# Patient Record
Sex: Male | Born: 1955 | Race: Black or African American | Hispanic: No | Marital: Single | Smoking: Current every day smoker
Health system: Southern US, Community
[De-identification: ages and names within clinical notes are randomized; demographics above are authoritative.]

## PROBLEM LIST (undated history)

## (undated) DIAGNOSIS — M5126 Other intervertebral disc displacement, lumbar region: Secondary | ICD-10-CM

---

## 2017-05-05 ENCOUNTER — Emergency Department (HOSPITAL_COMMUNITY): Payer: Self-pay

## 2017-05-05 ENCOUNTER — Emergency Department (HOSPITAL_COMMUNITY)
Admission: EM | Admit: 2017-05-05 | Discharge: 2017-05-05 | Disposition: A | Discharge status: Home / Self Care | Payer: Self-pay | Attending: Emergency Medicine | Admitting: Emergency Medicine

## 2017-05-05 ENCOUNTER — Encounter (HOSPITAL_COMMUNITY): Payer: Self-pay | Admitting: Emergency Medicine

## 2017-05-05 DIAGNOSIS — M5441 Lumbago with sciatica, right side: Secondary | ICD-10-CM | POA: Insufficient documentation

## 2017-05-05 DIAGNOSIS — M5431 Sciatica, right side: Secondary | ICD-10-CM

## 2017-05-05 HISTORY — DX: Other intervertebral disc displacement, lumbar region: M51.26

## 2017-05-05 MED ORDER — CYCLOBENZAPRINE HCL 10 MG PO TABS: 10.0000 mg | ORAL_TABLET | Freq: Every day | ORAL | 0 refills | Status: DC

## 2017-05-05 MED ORDER — HYDROCODONE-ACETAMINOPHEN 5-325 MG PO TABS: 1.0000 | ORAL_TABLET | Freq: Four times a day (QID) | ORAL | 0 refills | Status: DC | PRN

## 2017-05-05 MED ORDER — PREDNISONE 50 MG PO TABS: 50.0000 mg | ORAL_TABLET | Freq: Every day | ORAL | 0 refills | Status: DC

## 2017-05-05 MED ORDER — TRAMADOL HCL 50 MG PO TABS
50.0000 mg | ORAL_TABLET | Freq: Once | ORAL | Status: AC
  Administered 2017-05-05: 50 mg via ORAL
  Filled 2017-05-05: qty 1

## 2017-05-05 NOTE — ED Notes (Signed)
Patient transported to X-ray 

## 2017-05-05 NOTE — Discharge Instructions (Signed)
Return here as needed.  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  They can contribute to these symptoms to follow-up with the neurosurgeon provided
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
## 2017-05-05 NOTE — ED Triage Notes (Signed)
Pt is c/o right hip pain and numbness in his right thigh  Pt states he has a herniated disc   Pt states the numbness started about 2 weeks ago

## 2017-12-13 ENCOUNTER — Encounter (HOSPITAL_COMMUNITY): Payer: Self-pay

## 2017-12-13 ENCOUNTER — Other Ambulatory Visit: Payer: Self-pay

## 2017-12-13 ENCOUNTER — Emergency Department (HOSPITAL_COMMUNITY)
Admission: EM | Admit: 2017-12-13 | Discharge: 2017-12-13 | Disposition: A | Discharge status: Home / Self Care | Payer: Self-pay | Attending: Emergency Medicine | Admitting: Emergency Medicine

## 2017-12-13 ENCOUNTER — Emergency Department (HOSPITAL_COMMUNITY): Payer: Self-pay

## 2017-12-13 DIAGNOSIS — Y929 Unspecified place or not applicable: Secondary | ICD-10-CM | POA: Insufficient documentation

## 2017-12-13 DIAGNOSIS — S161XXA Strain of muscle, fascia and tendon at neck level, initial encounter: Secondary | ICD-10-CM | POA: Insufficient documentation

## 2017-12-13 DIAGNOSIS — Y999 Unspecified external cause status: Secondary | ICD-10-CM | POA: Insufficient documentation

## 2017-12-13 DIAGNOSIS — Y939 Activity, unspecified: Secondary | ICD-10-CM | POA: Insufficient documentation

## 2017-12-13 DIAGNOSIS — M542 Cervicalgia: Secondary | ICD-10-CM

## 2017-12-13 DIAGNOSIS — T148XXA Other injury of unspecified body region, initial encounter: Secondary | ICD-10-CM

## 2017-12-13 MED ORDER — METHOCARBAMOL 500 MG PO TABS: 500.0000 mg | ORAL_TABLET | Freq: Two times a day (BID) | ORAL | 0 refills | Status: DC

## 2017-12-13 MED ORDER — KETOROLAC TROMETHAMINE 15 MG/ML IJ SOLN
15.0000 mg | Freq: Once | INTRAMUSCULAR | Status: AC
  Administered 2017-12-13: 15 mg via INTRAMUSCULAR
  Filled 2017-12-13: qty 1
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
## 2017-12-13 NOTE — Discharge Instructions (Signed)
Your imaging is reassuring, neck pain is likely due to muscle strain and injury, this pain should improve over time with pain medication, muscle relaxers heat and light stretching.  If your symptoms are not improving after a few days of this treatment is follow-up with your primary doctor.  If you develop worsening neck pain, weakness, numbness or tingling in your arms or hands please return to the ED for sooner evaluation
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
## 2018-02-13 ENCOUNTER — Encounter (HOSPITAL_COMMUNITY): Payer: Self-pay | Admitting: Emergency Medicine

## 2018-02-13 ENCOUNTER — Emergency Department (HOSPITAL_COMMUNITY)
Admission: EM | Admit: 2018-02-13 | Discharge: 2018-02-13 | Disposition: A | Discharge status: Home / Self Care | Payer: No Typology Code available for payment source | Attending: Emergency Medicine | Admitting: Emergency Medicine

## 2018-02-13 ENCOUNTER — Other Ambulatory Visit: Payer: Self-pay

## 2018-02-13 DIAGNOSIS — Y9389 Activity, other specified: Secondary | ICD-10-CM | POA: Insufficient documentation

## 2018-02-13 DIAGNOSIS — Y92410 Unspecified street and highway as the place of occurrence of the external cause: Secondary | ICD-10-CM | POA: Diagnosis not present

## 2018-02-13 DIAGNOSIS — Z79899 Other long term (current) drug therapy: Secondary | ICD-10-CM | POA: Insufficient documentation

## 2018-02-13 DIAGNOSIS — F17210 Nicotine dependence, cigarettes, uncomplicated: Secondary | ICD-10-CM | POA: Insufficient documentation

## 2018-02-13 DIAGNOSIS — R42 Dizziness and giddiness: Secondary | ICD-10-CM | POA: Diagnosis not present

## 2018-02-13 DIAGNOSIS — Y999 Unspecified external cause status: Secondary | ICD-10-CM | POA: Insufficient documentation

## 2018-02-13 DIAGNOSIS — M62830 Muscle spasm of back: Secondary | ICD-10-CM | POA: Diagnosis not present

## 2018-02-13 DIAGNOSIS — M62838 Other muscle spasm: Secondary | ICD-10-CM

## 2018-02-13 DIAGNOSIS — M546 Pain in thoracic spine: Secondary | ICD-10-CM | POA: Diagnosis present

## 2018-02-13 MED ORDER — CYCLOBENZAPRINE HCL 10 MG PO TABS: 10.0000 mg | ORAL_TABLET | Freq: Two times a day (BID) | ORAL | 0 refills | Status: DC | PRN
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
## 2018-02-13 NOTE — Discharge Instructions (Signed)
Please read instructions below. Apply ice to your neck/back for 20 minutes at a time. You can take advil/ibuprofen every 6 hours as needed for pain. You can take flexeril as needed for muscle spasm. Schedule an appointment with your primary care to follow-up if symptoms persist. Return to the ER for severe headache, vision changes, vomiting, or new or concerning symptoms.
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
## 2021-09-02 ENCOUNTER — Telehealth: Payer: Self-pay

## 2021-09-02 ENCOUNTER — Other Ambulatory Visit (HOSPITAL_COMMUNITY): Payer: Self-pay

## 2021-09-02 NOTE — Telephone Encounter (Signed)
[facility] Patient Advocate Encounter  Insurance verification completed.    The patient is insured through [id number].  Medication will need a PA.  We will continue to follow to see if copay assistance is needed.  Karlis Cregg, CPhT [facility] Phone: [phone] Fax:  [phone]  

## 2021-09-03 ENCOUNTER — Encounter: Payer: Self-pay | Admitting: Family

## 2021-11-05 ENCOUNTER — Encounter: Payer: Medicaid Other | Admitting: Family

## 2021-11-11 ENCOUNTER — Encounter: Payer: Medicaid Other | Admitting: Internal Medicine

## 2021-11-16 ENCOUNTER — Encounter: Payer: Medicaid Other | Admitting: Internal Medicine

## 2021-11-16 ENCOUNTER — Telehealth: Payer: Self-pay

## 2021-11-16 DIAGNOSIS — Z5900 Homelessness unspecified: Secondary | ICD-10-CM | POA: Insufficient documentation

## 2021-11-16 DIAGNOSIS — B182 Chronic viral hepatitis C: Secondary | ICD-10-CM | POA: Insufficient documentation

## 2021-11-16 DIAGNOSIS — R030 Elevated blood-pressure reading, without diagnosis of hypertension: Secondary | ICD-10-CM | POA: Insufficient documentation

## 2021-11-16 NOTE — Telephone Encounter (Signed)
Patient No Showed on 11/16/21 with Dr Earlene Plater. I spoke to him and discussed no show policy. Patient has been rescheduled with Dr Renold Don for 11/24/2021 930 AM and agrees he will call 24-48 hours prior if he can not keep this appt

## 2021-11-16 NOTE — Progress Notes (Deleted)
[facility]  Reason for Consult: Chronic hepatitis C  Referring Provider: [facility]   HPI:    Ricky Reeves is a 65 y.o. male who presents for initial evaluation and management of chronic hepatitis C.   Past medical history as below.  Patient has a history of being diagnosed with hepatitis C approximately 3 years ago.  He has never been evaluated for further follow up or treatment.  He has a history of injection drug use and housing instability.  He has no recent lab work available for review.  He has no complaints today such as fevers, chills, nausea, vomiting, abdominal pain, jaundice.  He is interested in treatment for his chronic infection.  Patient's Medications  New Prescriptions   No medications on file  Previous Medications   ASPIRIN-CAFFEINE (BAYER BACK & BODY PO)    Take 2 tablets by mouth as needed (pain).   CYCLOBENZAPRINE (FLEXERIL) 10 MG TABLET    Take 1 tablet (10 mg total) by mouth 2 (two) times daily as needed for muscle spasms.   HYDROCODONE-ACETAMINOPHEN (NORCO/VICODIN) 5-325 MG TABLET    Take 1 tablet by mouth every 6 (six) hours as needed for moderate pain.   METHOCARBAMOL (ROBAXIN) 500 MG TABLET    Take 1 tablet (500 mg total) by mouth 2 (two) times daily.   PREDNISONE (DELTASONE) 50 MG TABLET    Take 1 tablet (50 mg total) by mouth daily.  Modified Medications   No medications on file  Discontinued Medications   No medications on file      Past Medical History:  Diagnosis Date   Lumbar herniated disc     Social History   Tobacco Use   Smoking status: Every Day    Packs/day: 0.50    Types: Cigarettes   Smokeless tobacco: Never  Vaping Use   Vaping Use: Never used  Substance Use Topics   Alcohol use: Yes    Comment: beer 2-3 times a week   Drug use: No    Family History  Problem Relation Age of Onset   Cancer Mother    Diabetes Brother     No Known  Allergies  ROS    OBJECTIVE:    There were no vitals filed for this visit.   There is no height or weight on file to calculate BMI.  Physical Exam    ASSESSMENT & PLAN:    No problem-specific Assessment & Plan notes found for this encounter.   No orders of the defined types were placed in this encounter.    There are no diagnoses linked to this encounter.   New Patient with Chronic Hepatitis C genotype unknown, untreated.   I discussed with the patient the lab findings that confirm chronic hepatitis C.  I discussed the pathogenesis, transmission, prevention, risks of left untreated, and treatment options for hepatitis C. I  discussed the importance and benefits of treatment.  PLAN: -- Patient counseled on limiting acetaminophen, avoidance of alcohol. -- Transmission discussed with patient including sexual transmission, injection drug use, sharing razors and toothbrush.   -- Will need referral to gastroenterology if concern for cirrhosis -- Will prescribe appropriate medication based on genotype and coverage  -- Hepatitis A and B titers with vaccination as needed -- Pneumococcal vaccine if not previously given and evidence of cirrhosis -- Further work up to include liver staging with Fibrosis scoring and calculated FIB-4 score.  Elastography if concern for  advanced fibrosis and/or cirrhosis -- Will follow up after starting medication -- Labs needed within the last 6 months:   CBC CMP PT/INR Hep A and B serologies  HIV HCV genotype HCV RNA Measure of fibrosis NS5A resistance testing in select scenarios   Vedia Coffer for Infectious Disease [facility] 11/16/2021, 7:52 AM

## 2021-11-24 ENCOUNTER — Ambulatory Visit: Payer: Medicare Other | Admitting: Internal Medicine

## 2021-12-02 ENCOUNTER — Ambulatory Visit: Payer: Medicare Other | Admitting: Internal Medicine

## 2021-12-03 ENCOUNTER — Ambulatory Visit: Payer: Medicare Other | Admitting: Internal Medicine

## 2021-12-25 ENCOUNTER — Other Ambulatory Visit: Payer: Self-pay | Admitting: Nurse Practitioner

## 2021-12-25 ENCOUNTER — Other Ambulatory Visit (HOSPITAL_COMMUNITY): Payer: Self-pay | Admitting: Nurse Practitioner

## 2021-12-25 DIAGNOSIS — F17210 Nicotine dependence, cigarettes, uncomplicated: Secondary | ICD-10-CM

## 2022-01-05 ENCOUNTER — Ambulatory Visit (INDEPENDENT_AMBULATORY_CARE_PROVIDER_SITE_OTHER): Payer: Medicare Other | Admitting: Internal Medicine

## 2022-01-05 ENCOUNTER — Other Ambulatory Visit (HOSPITAL_COMMUNITY)
Admission: RE | Admit: 2022-01-05 | Discharge: 2022-01-05 | Disposition: A | Discharge status: Home / Self Care | Payer: Medicare Other | Source: Ambulatory Visit | Attending: Internal Medicine | Admitting: Internal Medicine

## 2022-01-05 ENCOUNTER — Other Ambulatory Visit: Payer: Self-pay

## 2022-01-05 ENCOUNTER — Encounter: Payer: Self-pay | Admitting: Internal Medicine

## 2022-01-05 VITALS — BP 169/76 | HR 90 | Temp 98.2°F | Wt 185.0 lb

## 2022-01-05 DIAGNOSIS — B182 Chronic viral hepatitis C: Secondary | ICD-10-CM

## 2022-01-05 DIAGNOSIS — Z113 Encounter for screening for infections with a predominantly sexual mode of transmission: Secondary | ICD-10-CM

## 2022-01-05 NOTE — Progress Notes (Signed)
[facility]  Reason for Consult:new hep c patient evaluation Referring Provider: [facility] -- Verdene Lennert    Patient Active Problem List   Diagnosis Date Noted   Chronic viral hepatitis C (HCC) 11/16/2021   Homelessness 11/16/2021   Elevated blood pressure reading 11/16/2021      HPI: Ricky Reeves is a 66 y.o. male homeless, referred to [facility] for hep c evaluation  He is new to [place]. Reviewed outside medical record Dx'ed with hep c 2020 -- no labs record to review No prior treatment   Other hx: Prior syphilis -- last treated about half a year prior to this 12/2021 visit. Treated once only.   Timing of diagnosis: 3 years prior to 12/2021 visit  Risk: hx ivdu more than 10 years prior to this initial visit, hx of indu (cocaine) last use 3 years prior to this visit; no tatoos or blood transfusion; hx unprotected sex but unclear exposure  Cirrhosis finding: no hx n/v/hematemesis, bloody/black stool, abd distension, fluid withdrawal from abd/lungs, hx confusion, fatigue, edema, weight loss, poor apetite  Extra gi manifestation: No abnormal skin rash, fatigue, diffuse joint pain/swelling, hx kidney disease  Social situation: Homeless; no shelter; no stable address Phone [phone] No current iv/indu Smokes Etoh -- at least two forty ounce bottles a day. Gets drunk and shakes but no seizure prior  We reviewed: Natural history of hep c and its complications available treatment options for hepatitis C Other factors potentially worsening liver disease, including alcohol use; obesity; diabetes mellitus, and viral coinfection We discussed potential medications that can contribute to liver inflammation like acetaminophen, and to avoiding excessive amount (more than 3 gram daily use) acetaminophen    Review of Systems: ROS All other ros negative      Past Medical History:  Diagnosis Date   Lumbar herniated disc      Social History   Tobacco Use   Smoking status: Every Day    Packs/day: 0.50    Types: Cigarettes   Smokeless tobacco: Never  Vaping Use   Vaping Use: Never used  Substance Use Topics   Alcohol use: Yes    Comment: beer 2-3 times a week   Drug use: No    Family History  Problem Relation Age of Onset   Cancer Mother    Diabetes Brother     No Known Allergies  OBJECTIVE: There were no vitals filed for this visit. There is no height or weight on file to calculate BMI.   Physical Exam General/constitutional: no distress, pleasant HEENT: Normocephalic, PER, Conj Clear, EOMI, Oropharynx clear Neck supple CV: rrr no mrg Lungs: clear to auscultation, normal respiratory effort Abd: Soft, Nontender Ext: no edema Skin: No Rash Neuro: nonfocal MSK: no peripheral joint swelling/tenderness/warmth; back spines nontender   Lab:  Microbiology:  Serology:  Imaging:   Assessment/plan: Problem List Items Addressed This Visit       Digestive   Chronic viral hepatitis C (HCC) - Primary   Relevant Orders   CBC   Hepatitis C RNA quantitative   Protime-INR   Hepatitis C genotype   Hepatitis C antibody   COMPLETE METABOLIC PANEL WITH GFR   Hepatitis B Core Antibody, total   Hepatitis B surface antibody,quantitative   Hepatitis B Surface AntiGEN   Hepatitis A Ab, Total   Other Visit Diagnoses     Screening for STDs (sexually transmitted diseases)       Relevant Orders  HIV antibody (with reflex)   RPR   Fluorescent treponemal ab(fta)-IgG-bld       #chronic hep c  Prior treatment: no GT: unknown Evidence of cirrhosis: not physically or by history -- will get labs and elastography Interested in treatment Potential DDI: none (no other ppi/statin or meds)   -Labs: today -Imaging: planning elastography -follow up: 4 weeks -Meds planned: unclear at this time, suspect mavyret  -discussed natural progression of hep c, transmission (avoid sharing  personal hygiene equipment) -discussed avoid toxin like etoh and excessive acetamaminphen (no more than 2 gram a day) -discussed healthy life style and good glucose control -discussed avoiding eating raw sea food -discussed we can treat hep c but can be reinfected -discussed hepatitis coinfection and vaccination   #std screening Repeat rpr testing. 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  I have spent a total of 65 minutes of face-to-face and non-face-to-face time, excluding clinical staff time, preparing to see patient, ordering tests and/or medications, and provide counseling the patient    Raymondo Band, MD [facility] [phone] pager   [phone] cell 01/05/2022, 9:23 AM

## 2022-01-05 NOTE — Patient Instructions (Signed)
Blood test, urine test today  Liver imaging to be scheduled   We need all these information to decide treatment and see if other procedures are needed to be done   Make follow up appointment with me in 4 weeks

## 2022-01-06 LAB — URINE CYTOLOGY ANCILLARY ONLY
Chlamydia: NEGATIVE
Comment: NEGATIVE
Comment: NEGATIVE
Comment: NORMAL
Neisseria Gonorrhea: NEGATIVE
Trichomonas: NEGATIVE

## 2022-01-10 LAB — COMPLETE METABOLIC PANEL WITH GFR
AG Ratio: 0.8 (calc) — ABNORMAL LOW (ref 1.0–2.5)
ALT: 198 U/L — ABNORMAL HIGH (ref 9–46)
AST: 234 U/L — ABNORMAL HIGH (ref 10–35)
Albumin: 3.7 g/dL (ref 3.6–5.1)
Alkaline phosphatase (APISO): 61 U/L (ref 35–144)
BUN: 14 mg/dL (ref 7–25)
CO2: 29 mmol/L (ref 20–32)
Calcium: 9.1 mg/dL (ref 8.6–10.3)
Chloride: 102 mmol/L (ref 98–110)
Creat: 0.74 mg/dL (ref 0.70–1.35)
Globulin: 4.9 g/dL (calc) — ABNORMAL HIGH (ref 1.9–3.7)
Glucose, Bld: 96 mg/dL (ref 65–99)
Potassium: 4.3 mmol/L (ref 3.5–5.3)
Sodium: 136 mmol/L (ref 135–146)
Total Bilirubin: 1.3 mg/dL — ABNORMAL HIGH (ref 0.2–1.2)
Total Protein: 8.6 g/dL — ABNORMAL HIGH (ref 6.1–8.1)
eGFR: 101 mL/min/{1.73_m2} (ref >=60)

## 2022-01-10 LAB — CBC
HCT: 41 % (ref 38.5–50.0)
Hemoglobin: 14.3 g/dL (ref 13.2–17.1)
MCH: 34.8 pg — ABNORMAL HIGH (ref 27.0–33.0)
MCHC: 34.9 g/dL (ref 32.0–36.0)
MCV: 99.8 fL (ref 80.0–100.0)
MPV: 12.1 fL (ref 7.5–12.5)
Platelets: 137 10*3/uL — ABNORMAL LOW (ref 140–400)
RBC: 4.11 10*6/uL — ABNORMAL LOW (ref 4.20–5.80)
RDW: 11 % (ref 11.0–15.0)
WBC: 3.8 10*3/uL (ref 3.8–10.8)

## 2022-01-10 LAB — RPR TITER: RPR Titer: 1:4 {titer} — ABNORMAL HIGH

## 2022-01-10 LAB — HEPATITIS B SURFACE ANTIBODY, QUANTITATIVE: Hepatitis B-Post: 11 m[IU]/mL (ref >=10)

## 2022-01-10 LAB — HIV ANTIBODY (ROUTINE TESTING W REFLEX): HIV 1&2 Ab, 4th Generation: NONREACTIVE

## 2022-01-10 LAB — HEPATITIS C RNA QUANTITATIVE
HCV Quantitative Log: 6.35 log IU/mL — ABNORMAL HIGH
HCV RNA, PCR, QN: 2230000 IU/mL — ABNORMAL HIGH

## 2022-01-10 LAB — PROTIME-INR
INR: 1.1
Prothrombin Time: 10.9 s (ref 9.0–11.5)

## 2022-01-10 LAB — FLUORESCENT TREPONEMAL AB(FTA)-IGG-BLD: Fluorescent Treponemal ABS: REACTIVE — AB

## 2022-01-10 LAB — HEPATITIS B CORE ANTIBODY, TOTAL: Hep B Core Total Ab: REACTIVE — AB

## 2022-01-10 LAB — HEPATITIS B SURFACE ANTIGEN: Hepatitis B Surface Ag: NONREACTIVE

## 2022-01-10 LAB — HEPATITIS C ANTIBODY
Hepatitis C Ab: REACTIVE — AB
SIGNAL TO CUT-OFF: 10.61 — ABNORMAL HIGH (ref <1.00)

## 2022-01-10 LAB — HEPATITIS A ANTIBODY, TOTAL: Hepatitis A AB,Total: NONREACTIVE

## 2022-01-10 LAB — HEPATITIS C GENOTYPE

## 2022-01-10 LAB — RPR: RPR Ser Ql: REACTIVE — AB

## 2022-01-11 ENCOUNTER — Telehealth: Payer: Self-pay

## 2022-01-11 NOTE — Telephone Encounter (Signed)
Attempted to call Gwenlyn Perking with [facility] to see if patient had a prior RPR and tx - no voicemail box set up to leave a message.    [place], CMA

## 2022-01-11 NOTE — Telephone Encounter (Signed)
-----   Message from Jabier Mutton, MD sent at 01/11/2022  8:38 AM EST ----- Hi team. Could you help me get his prior rpr and tx info from [place].  Thanks

## 2022-01-13 ENCOUNTER — Ambulatory Visit (HOSPITAL_COMMUNITY): Payer: Medicare Other

## 2022-01-13 ENCOUNTER — Ambulatory Visit (HOSPITAL_COMMUNITY): Admission: RE | Admit: 2022-01-13 | Payer: Medicare Other | Source: Ambulatory Visit

## 2022-01-13 ENCOUNTER — Encounter (HOSPITAL_COMMUNITY): Payer: Self-pay

## 2022-01-13 NOTE — Telephone Encounter (Signed)
Spoke to [facility] - Most recent RPR 1.512 Treated on 01/26/2021 with bicillin 2.4 x 1

## 2022-01-20 ENCOUNTER — Other Ambulatory Visit (HOSPITAL_COMMUNITY): Payer: Medicare Other

## 2022-01-25 ENCOUNTER — Ambulatory Visit (HOSPITAL_COMMUNITY): Payer: Medicare Other

## 2022-02-04 ENCOUNTER — Encounter (HOSPITAL_COMMUNITY): Payer: Self-pay

## 2022-02-04 ENCOUNTER — Ambulatory Visit (HOSPITAL_COMMUNITY): Admission: RE | Admit: 2022-02-04 | Payer: Medicare Other | Source: Ambulatory Visit

## 2022-02-04 ENCOUNTER — Ambulatory Visit (HOSPITAL_COMMUNITY): Payer: Medicare Other

## 2022-02-16 ENCOUNTER — Ambulatory Visit (HOSPITAL_COMMUNITY)
Admission: RE | Admit: 2022-02-16 | Discharge: 2022-02-16 | Disposition: A | Discharge status: Home / Self Care | Payer: Medicare Other | Source: Ambulatory Visit | Attending: Internal Medicine | Admitting: Internal Medicine

## 2022-02-16 ENCOUNTER — Other Ambulatory Visit: Payer: Self-pay

## 2022-02-16 ENCOUNTER — Ambulatory Visit (HOSPITAL_COMMUNITY)
Admission: RE | Admit: 2022-02-16 | Discharge: 2022-02-16 | Disposition: A | Discharge status: Home / Self Care | Payer: Medicare Other | Source: Ambulatory Visit | Attending: Nurse Practitioner | Admitting: Nurse Practitioner

## 2022-02-16 DIAGNOSIS — F17210 Nicotine dependence, cigarettes, uncomplicated: Secondary | ICD-10-CM | POA: Insufficient documentation

## 2022-02-16 DIAGNOSIS — B182 Chronic viral hepatitis C: Secondary | ICD-10-CM | POA: Diagnosis present

## 2022-03-25 ENCOUNTER — Telehealth: Payer: Self-pay | Admitting: Internal Medicine

## 2022-03-25 NOTE — Telephone Encounter (Signed)
Called pt back to schedule a f/u appt with Dr. Gale Journey. Pt did not answer, and no VM available.  ?

## 2022-04-01 ENCOUNTER — Ambulatory Visit: Payer: Medicare Other | Admitting: Internal Medicine

## 2022-04-21 ENCOUNTER — Ambulatory Visit: Payer: Medicare Other | Admitting: Internal Medicine

## 2022-04-22 ENCOUNTER — Other Ambulatory Visit: Payer: Self-pay

## 2022-04-22 ENCOUNTER — Ambulatory Visit (INDEPENDENT_AMBULATORY_CARE_PROVIDER_SITE_OTHER): Payer: Medicare Other | Admitting: Internal Medicine

## 2022-04-22 ENCOUNTER — Telehealth: Payer: Self-pay

## 2022-04-22 ENCOUNTER — Encounter: Payer: Self-pay | Admitting: Internal Medicine

## 2022-04-22 ENCOUNTER — Other Ambulatory Visit (HOSPITAL_COMMUNITY): Payer: Self-pay

## 2022-04-22 VITALS — BP 145/88 | HR 86 | Temp 97.2°F | Ht 71.0 in | Wt 175.0 lb

## 2022-04-22 DIAGNOSIS — B182 Chronic viral hepatitis C: Secondary | ICD-10-CM | POA: Diagnosis not present

## 2022-04-22 DIAGNOSIS — Z8619 Personal history of other infectious and parasitic diseases: Secondary | ICD-10-CM

## 2022-04-22 NOTE — Progress Notes (Addendum)
?  ? ? ? ? ?[facility] ? ?Reason for Consult:new hep c patient evaluation ?Referring Provider: [facility] -- Lily Peer ? ? ? ?Patient Active Problem List  ? Diagnosis Date Noted  ? Chronic viral hepatitis C ([place]) 11/16/2021  ? Homelessness 11/16/2021  ? Elevated blood pressure reading 11/16/2021  ? ? ? ? ?HPI: Ricky Reeves is a 66 y.o. male homeless, referred to [facility] for hep c evaluation ? 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  ?Hepatitis screening showed prior hep b infection and immune/cleared ?Hiv screen negative ? ?Hep c GT1 ? ? ?I first saw him 01/05/2022 for initial visit: ?-------------- ?He is new to [place]. ?Reviewed outside medical record ?Dx'ed with hep c 2020 -- no labs record to review ?No prior treatment  ? ?Other hx: ?Prior syphilis -- last treated about half a year prior to this 12/2021 visit. Treated once only.  ? ?Timing of diagnosis: ?3 years prior to 12/2021 visit ? ?Risk: hx ivdu more than 10 years prior to this initial visit, hx of indu (cocaine) last use 3 years prior to this visit; no tatoos or blood transfusion; hx unprotected sex but unclear exposure ? ?Cirrhosis finding: no hx n/v/hematemesis, bloody/black stool, abd distension, fluid withdrawal from abd/lungs, hx confusion, fatigue, edema, weight loss, poor apetite ? ?Extra gi manifestation: No abnormal skin rash, fatigue, diffuse joint pain/swelling, hx kidney disease ? ?Social situation: ?Homeless; no shelter; no stable address ?Phone [phone] ?No current iv/indu ?Smokes ?Etoh -- at least two forty ounce bottles a day. Gets drunk and shakes but no seizure prior ? ?We reviewed: ?Natural history of hep c and its complications ?available treatment options for hepatitis C ?Other factors potentially  worsening liver disease, including alcohol use; obesity; diabetes mellitus, and viral coinfection ?We discussed potential medications that can contribute to liver inflammation like acetaminophen, and to avoiding excessive amount (more than 3 gram daily use) acetaminophen ? ? ? ?Review of Systems: ?ROS ?All other ros negative ? ? ? ? ? ?Past Medical History:  ?Diagnosis Date  ? Lumbar herniated disc   ? ? ?Social History  ? ?Tobacco Use  ? Smoking status: Every Day  ?  Packs/day: 0.50  ?  Types: Cigarettes  ? Smokeless tobacco: Never  ?Vaping Use  ? Vaping Use: Never used  ?Substance Use Topics  ? Alcohol use: Yes  ?  Comment: 2 40 once beers a day  ? Drug use: Yes  ?  Types: Marijuana  ?  Comment: sometimes  ? ? ?Family History  ?Problem Relation Age of Onset  ? Cancer Mother   ? Diabetes Brother   ? ? ?No Known Allergies ? ?OBJECTIVE: ?Vitals:  ? 04/22/22 1004  ?BP: (!) 145/88  ?Pulse: 86  ?Temp: (!) 97.2 ?F (36.2 ?C)  ?TempSrc: Oral  ?SpO2: 98%  ?Weight: 175 lb (79.4 kg)  ?Height: $RemoveB'5\' 11"'Dkbgzqch$  (1.803 m)  ? ?Body mass index is 24.41 kg/m?. ? ? ?Physical Exam ?General/constitutional: no distress, pleasant ?HEENT: Normocephalic, PER, Conj Clear, EOMI, Oropharynx clear ?Neck supple ?CV: rrr no mrg ?Lungs: clear to auscultation, normal respiratory effort ?Abd: Soft, Nontender ?Ext: no edema ?Skin: No Rash ?Neuro: nonfocal ?MSK: no peripheral joint swelling/tenderness/warmth; back spines nontender ? ? ? ? ?Lab: ?Lab Results  ?Component Value Date  ? WBC 3.8 01/05/2022  ?  HGB 14.3 01/05/2022  ? HCT 41.0 01/05/2022  ? MCV 99.8 01/05/2022  ? PLT 137 (L) 01/05/2022  ? ?Last metabolic panel ?Lab Results  ?Component Value Date  ? GLUCOSE 96 01/05/2022  ? NA 136 01/05/2022  ? K 4.3 01/05/2022  ? CL 102 01/05/2022  ? CO2 29 01/05/2022  ? BUN 14 01/05/2022  ? CREATININE 0.74 01/05/2022  ? EGFR 101 01/05/2022  ? CALCIUM 9.1 01/05/2022  ? PROT 8.6 (H) 01/05/2022  ? BILITOT 1.3 (H) 01/05/2022  ? AST 234 (H) 01/05/2022  ? ALT 198 (H)  01/05/2022  ? ?Fib4 = 7.9 ? ? ?Microbiology: ? ?Serology: ?12/2021 hep c gt1a; vl 2.74mil ?12/2021 hep b sAb 11; cAb positive; sAg negative ?12/2021 rpr 1:4 ?12/2021 hiv negative ? ?Imaging: ? ? ?Assessment/plan: ?Problem List Items Addressed This Visit   ? ?  ? Digestive  ? Chronic viral hepatitis C ([place]) - Primary  ? ? ?#chronic hep c ? ?Prior treatment: no ?GT: 1a ?Evidence of cirrhosis: not physically or by history -- fib4 suggest advance fibrosis but elastography doesn't. I worry he actually might have advance fibrosis with chronic alcohol use. Talked about getting liver biopsy --> he'll want to think about it but agrees to schedule the biopsy several weeks from now ?Interested in treatment ?Potential DDI: none (no other ppi/statin or meds) ? ?Discussed with him that we need to be certain about cirrhosis status not just for treatment but for ongoing care as well. ? ?-Labs: no need today ?-Imaging: will refer for liver biopsy by IR ?-follow up: 2 months ?-Meds planned: unclear at this time, suspect epclusa given at least I suspect fibrosis with chronic alcohol use. Will see after liver biopsy ?-advise him he needs to get off alcohol -- he said he is thinking about the detox ? ? ? ?-discussed natural progression of hep c, transmission (avoid sharing personal hygiene equipment) ?-discussed avoid toxin like etoh and excessive acetamaminphen (no more than 2 gram a day) ?-discussed healthy life style and good glucose control ?-discussed avoiding eating raw sea food ?-discussed we can treat hep c but can be reinfected ?-discussed hepatitis coinfection and vaccination ? ? ?#std screening ?#hx syphilis ? ?He was treated within the past year 2022 by the [facility] ?He denies sexual exposure since then ?Hiv/gc/chlam negative ? ?-will check previous rpr titer record ?-will repeat rpr testing in the next 6-12 months ? ? ?#homelessness ?He's trying to get a place to stay. He is sleeping in his car now.  ? ? ? ? ?Follow-up: Return in  about 2 months (around 06/22/2022). ? ? ?-------- ?Addendum ?[facility] rpr labs: ?12/2020 1:512 ?08/2021 1:8 ?12/2021 1:4 ? ?He was treated sometimes in 2022 after the 512 titer as early latent.  ? ?Jabier Mutton, MD ?[facility] ?[place] ?[phone] pager   [phone] cell ?04/22/2022, 10:15 AM ? ?

## 2022-04-22 NOTE — Telephone Encounter (Signed)
Per Zella Ball at [facility], RPR history is as follows:  ? ?Jan 2022 RPR 1:512 ?Sept 2022 RPR 1:8 ?Jan 2023 RPR 1:4 ? ?Sandie Ano, RN ? ?

## 2022-04-22 NOTE — Patient Instructions (Signed)
Will need to get a liver biopsy to see how to care for you ? ? ?I will order for the radiologist to get a liver biopsy within the next 4-6 weeks ? ? ?See me 2 weeks after your liver biopsy to decide on treatment ? ? ?Will follow up with your dhs department about your syphilis status ?

## 2022-04-22 NOTE — Telephone Encounter (Signed)
[facility] Patient Advocate Encounter ?  ?Received notification from [employer] Medicare Part D that prior authorization for Raeanne Gathers is required. ?  ?PA submitted on 04/22/22 ?Key [id number] ?Status is pending ?   ?[facility] will continue to follow. ? ? ?Ileene Patrick, CPhT ?Specialty Pharmacy Patient Advocate ?[facility] ?Phone: [phone] ?Fax:  [phone]  ?

## 2022-04-23 ENCOUNTER — Other Ambulatory Visit (HOSPITAL_COMMUNITY): Payer: Self-pay

## 2022-04-23 ENCOUNTER — Other Ambulatory Visit: Payer: Self-pay

## 2022-04-23 ENCOUNTER — Emergency Department (HOSPITAL_COMMUNITY): Payer: Medicare Other

## 2022-04-23 ENCOUNTER — Telehealth: Payer: Self-pay

## 2022-04-23 ENCOUNTER — Inpatient Hospital Stay (HOSPITAL_COMMUNITY)
Admission: EM | Admit: 2022-04-23 | Discharge: 2022-04-26 | DRG: 200 | Disposition: A | Discharge status: Home / Self Care | Payer: Medicare Other | Attending: Surgery | Admitting: Surgery

## 2022-04-23 DIAGNOSIS — F17210 Nicotine dependence, cigarettes, uncomplicated: Secondary | ICD-10-CM | POA: Diagnosis present

## 2022-04-23 DIAGNOSIS — Z20822 Contact with and (suspected) exposure to covid-19: Secondary | ICD-10-CM | POA: Diagnosis present

## 2022-04-23 DIAGNOSIS — J939 Pneumothorax, unspecified: Principal | ICD-10-CM

## 2022-04-23 DIAGNOSIS — Z833 Family history of diabetes mellitus: Secondary | ICD-10-CM

## 2022-04-23 DIAGNOSIS — B182 Chronic viral hepatitis C: Secondary | ICD-10-CM | POA: Diagnosis present

## 2022-04-23 DIAGNOSIS — S270XXA Traumatic pneumothorax, initial encounter: Secondary | ICD-10-CM | POA: Diagnosis not present

## 2022-04-23 DIAGNOSIS — S060XAA Concussion with loss of consciousness status unknown, initial encounter: Secondary | ICD-10-CM | POA: Diagnosis present

## 2022-04-23 DIAGNOSIS — S2242XA Multiple fractures of ribs, left side, initial encounter for closed fracture: Secondary | ICD-10-CM

## 2022-04-23 DIAGNOSIS — S2239XA Fracture of one rib, unspecified side, initial encounter for closed fracture: Secondary | ICD-10-CM | POA: Diagnosis present

## 2022-04-23 DIAGNOSIS — K7010 Alcoholic hepatitis without ascites: Secondary | ICD-10-CM | POA: Diagnosis present

## 2022-04-23 DIAGNOSIS — K760 Fatty (change of) liver, not elsewhere classified: Secondary | ICD-10-CM | POA: Diagnosis present

## 2022-04-23 DIAGNOSIS — S27321A Contusion of lung, unilateral, initial encounter: Secondary | ICD-10-CM | POA: Diagnosis present

## 2022-04-23 DIAGNOSIS — Z5900 Homelessness unspecified: Secondary | ICD-10-CM

## 2022-04-23 DIAGNOSIS — J9811 Atelectasis: Secondary | ICD-10-CM | POA: Diagnosis present

## 2022-04-23 DIAGNOSIS — S2249XA Multiple fractures of ribs, unspecified side, initial encounter for closed fracture: Secondary | ICD-10-CM | POA: Diagnosis present

## 2022-04-23 DIAGNOSIS — Z809 Family history of malignant neoplasm, unspecified: Secondary | ICD-10-CM

## 2022-04-23 DIAGNOSIS — D696 Thrombocytopenia, unspecified: Secondary | ICD-10-CM | POA: Diagnosis present

## 2022-04-23 DIAGNOSIS — Z79899 Other long term (current) drug therapy: Secondary | ICD-10-CM

## 2022-04-23 DIAGNOSIS — F1020 Alcohol dependence, uncomplicated: Secondary | ICD-10-CM | POA: Diagnosis present

## 2022-04-23 LAB — CBC WITH DIFFERENTIAL/PLATELET
Abs Immature Granulocytes: 0.03 10*3/uL (ref 0.00–0.07)
Basophils Absolute: 0.1 10*3/uL (ref 0.0–0.1)
Basophils Relative: 1 %
Eosinophils Absolute: 0 10*3/uL (ref 0.0–0.5)
Eosinophils Relative: 1 %
HCT: 38 % — ABNORMAL LOW (ref 39.0–52.0)
Hemoglobin: 12.6 g/dL — ABNORMAL LOW (ref 13.0–17.0)
Immature Granulocytes: 1 %
Lymphocytes Relative: 41 %
Lymphs Abs: 2.1 10*3/uL (ref 0.7–4.0)
MCH: 34.7 pg — ABNORMAL HIGH (ref 26.0–34.0)
MCHC: 33.2 g/dL (ref 30.0–36.0)
MCV: 104.7 fL — ABNORMAL HIGH (ref 80.0–100.0)
Monocytes Absolute: 0.6 10*3/uL (ref 0.1–1.0)
Monocytes Relative: 12 %
Neutro Abs: 2.3 10*3/uL (ref 1.7–7.7)
Neutrophils Relative %: 44 %
Platelets: 142 10*3/uL — ABNORMAL LOW (ref 150–400)
RBC: 3.63 MIL/uL — ABNORMAL LOW (ref 4.22–5.81)
RDW: 11.8 % (ref 11.5–15.5)
WBC: 5.1 10*3/uL (ref 4.0–10.5)
nRBC: 0 % (ref 0.0–0.2)

## 2022-04-23 LAB — COMPREHENSIVE METABOLIC PANEL
ALT: 163 U/L — ABNORMAL HIGH (ref 0–44)
AST: 214 U/L — ABNORMAL HIGH (ref 15–41)
Albumin: 3.3 g/dL — ABNORMAL LOW (ref 3.5–5.0)
Alkaline Phosphatase: 49 U/L (ref 38–126)
Anion gap: 8 (ref 5–15)
BUN: 12 mg/dL (ref 8–23)
CO2: 25 mmol/L (ref 22–32)
Calcium: 8.5 mg/dL — ABNORMAL LOW (ref 8.9–10.3)
Chloride: 102 mmol/L (ref 98–111)
Creatinine, Ser: 0.82 mg/dL (ref 0.61–1.24)
GFR, Estimated: >60 mL/min (ref >60)
Glucose, Bld: 112 mg/dL — ABNORMAL HIGH (ref 70–99)
Potassium: 3.2 mmol/L — ABNORMAL LOW (ref 3.5–5.1)
Sodium: 135 mmol/L (ref 135–145)
Total Bilirubin: 0.5 mg/dL (ref 0.3–1.2)
Total Protein: 8 g/dL (ref 6.5–8.1)

## 2022-04-23 LAB — I-STAT CHEM 8, ED
BUN: 14 mg/dL (ref 8–23)
Calcium, Ion: 1.15 mmol/L (ref 1.15–1.40)
Chloride: 100 mmol/L (ref 98–111)
Creatinine, Ser: 1 mg/dL (ref 0.61–1.24)
Glucose, Bld: 108 mg/dL — ABNORMAL HIGH (ref 70–99)
HCT: 41 % (ref 39.0–52.0)
Hemoglobin: 13.9 g/dL (ref 13.0–17.0)
Potassium: 3.3 mmol/L — ABNORMAL LOW (ref 3.5–5.1)
Sodium: 139 mmol/L (ref 135–145)
TCO2: 26 mmol/L (ref 22–32)

## 2022-04-23 LAB — ETHANOL: Alcohol, Ethyl (B): 203 mg/dL — ABNORMAL HIGH (ref <10)

## 2022-04-23 LAB — SAMPLE TO BLOOD BANK

## 2022-04-23 LAB — PROTIME-INR
INR: 1 (ref 0.8–1.2)
Prothrombin Time: 13 seconds (ref 11.4–15.2)

## 2022-04-23 LAB — LACTIC ACID, PLASMA: Lactic Acid, Venous: 2 mmol/L (ref 0.5–1.9)

## 2022-04-23 MED ORDER — FENTANYL CITRATE PF 50 MCG/ML IJ SOSY
100.0000 ug | PREFILLED_SYRINGE | Freq: Once | INTRAMUSCULAR | Status: AC
Start: 2022-04-24 — End: 2022-04-24
  Administered 2022-04-24: 100 ug via INTRAVENOUS
  Filled 2022-04-23: qty 2

## 2022-04-23 MED ORDER — FENTANYL CITRATE PF 50 MCG/ML IJ SOSY
100.0000 ug | PREFILLED_SYRINGE | Freq: Once | INTRAMUSCULAR | Status: AC
  Administered 2022-04-23: 50 ug via INTRAVENOUS
  Filled 2022-04-23: qty 2

## 2022-04-23 MED ORDER — IOHEXOL 300 MG/ML  SOLN
100.0000 mL | Freq: Once | INTRAMUSCULAR | Status: AC | PRN
  Administered 2022-04-23: 100 mL via INTRAVENOUS

## 2022-04-23 NOTE — Telephone Encounter (Signed)
[facility] Patient Advocate Encounter ? ?Prior Authorization for [employer] (generic) has been approved.   ? ?PA# [id number] ?Effective dates: 04/22/22 through 07/15/22 ? ?Patients co-pay is $0.00.  ? ?Prescription can be sent to [facility]. ? ?[facility] will continue to follow. ? ?Clearance Coots, CPhT ?Specialty Pharmacy Patient Advocate ?[facility] ?Phone: [phone] ?Fax:  [phone]  ?

## 2022-04-23 NOTE — ED Notes (Signed)
Patient transported to CT 
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
## 2022-04-24 ENCOUNTER — Encounter (HOSPITAL_COMMUNITY): Payer: Self-pay | Admitting: Emergency Medicine

## 2022-04-24 DIAGNOSIS — S2249XA Multiple fractures of ribs, unspecified side, initial encounter for closed fracture: Secondary | ICD-10-CM | POA: Diagnosis present

## 2022-04-24 LAB — RESP PANEL BY RT-PCR (FLU A&B, COVID) ARPGX2
Influenza A by PCR: NEGATIVE
Influenza B by PCR: NEGATIVE
SARS Coronavirus 2 by RT PCR: NEGATIVE

## 2022-04-24 LAB — URINALYSIS, ROUTINE W REFLEX MICROSCOPIC
Bilirubin Urine: NEGATIVE
Glucose, UA: NEGATIVE mg/dL
Hgb urine dipstick: NEGATIVE
Ketones, ur: NEGATIVE mg/dL
Leukocytes,Ua: NEGATIVE
Nitrite: NEGATIVE
Protein, ur: NEGATIVE mg/dL
Specific Gravity, Urine: 1.043 — ABNORMAL HIGH (ref 1.005–1.030)
pH: 5 (ref 5.0–8.0)

## 2022-04-24 MED ORDER — LORAZEPAM 1 MG PO TABS: 1.0000 mg | ORAL_TABLET | ORAL | Status: DC | PRN

## 2022-04-24 MED ORDER — SODIUM CHLORIDE 0.9 % IV BOLUS
500.0000 mL | Freq: Once | INTRAVENOUS | Status: AC
Start: 2022-04-24 — End: 2022-04-24
  Administered 2022-04-24: 500 mL via INTRAVENOUS

## 2022-04-24 MED ORDER — PANTOPRAZOLE SODIUM 40 MG PO TBEC
40.0000 mg | DELAYED_RELEASE_TABLET | Freq: Every day | ORAL | Status: DC
  Administered 2022-04-24 – 2022-04-25 (×2): 40 mg via ORAL
  Filled 2022-04-24 (×3): qty 1

## 2022-04-24 MED ORDER — LORAZEPAM 2 MG/ML IJ SOLN: 1.0000 mg | INTRAMUSCULAR | Status: DC | PRN

## 2022-04-24 MED ORDER — THIAMINE HCL 100 MG PO TABS
100.0000 mg | ORAL_TABLET | Freq: Every day | ORAL | Status: DC
  Administered 2022-04-24 – 2022-04-25 (×2): 100 mg via ORAL
  Filled 2022-04-24 (×3): qty 1

## 2022-04-24 MED ORDER — ADULT MULTIVITAMIN W/MINERALS CH
1.0000 | ORAL_TABLET | Freq: Every day | ORAL | Status: DC
  Administered 2022-04-24 – 2022-04-26 (×3): 1 via ORAL
  Filled 2022-04-24 (×3): qty 1

## 2022-04-24 MED ORDER — DOCUSATE SODIUM 100 MG PO CAPS
100.0000 mg | ORAL_CAPSULE | Freq: Two times a day (BID) | ORAL | Status: DC
  Administered 2022-04-25 – 2022-04-26 (×3): 100 mg via ORAL
  Filled 2022-04-24 (×5): qty 1

## 2022-04-24 MED ORDER — POTASSIUM CHLORIDE IN NACL 20-0.9 MEQ/L-% IV SOLN
INTRAVENOUS | Status: DC
  Filled 2022-04-24 (×3): qty 1000

## 2022-04-24 MED ORDER — MORPHINE SULFATE (PF) 4 MG/ML IV SOLN
4.0000 mg | Freq: Once | INTRAVENOUS | Status: AC
  Administered 2022-04-24: 4 mg via INTRAVENOUS
  Filled 2022-04-24: qty 1

## 2022-04-24 MED ORDER — OXYCODONE HCL 5 MG PO TABS
10.0000 mg | ORAL_TABLET | ORAL | Status: DC | PRN
  Administered 2022-04-24 – 2022-04-26 (×9): 10 mg via ORAL
  Filled 2022-04-24 (×10): qty 2

## 2022-04-24 MED ORDER — THIAMINE HCL 100 MG/ML IJ SOLN
100.0000 mg | Freq: Every day | INTRAMUSCULAR | Status: DC
  Administered 2022-04-26: 100 mg via INTRAVENOUS
  Filled 2022-04-24 (×2): qty 2

## 2022-04-24 MED ORDER — PANTOPRAZOLE SODIUM 40 MG IV SOLR
40.0000 mg | Freq: Every day | INTRAVENOUS | Status: DC
  Administered 2022-04-26: 40 mg via INTRAVENOUS
  Filled 2022-04-24: qty 10

## 2022-04-24 MED ORDER — FOLIC ACID 1 MG PO TABS
1.0000 mg | ORAL_TABLET | Freq: Every day | ORAL | Status: DC
  Administered 2022-04-24 – 2022-04-26 (×3): 1 mg via ORAL
  Filled 2022-04-24 (×3): qty 1

## 2022-04-24 MED ORDER — HYDRALAZINE HCL 20 MG/ML IJ SOLN: 10.0000 mg | INTRAMUSCULAR | Status: DC | PRN

## 2022-04-24 MED ORDER — OXYCODONE HCL 5 MG PO TABS
5.0000 mg | ORAL_TABLET | ORAL | Status: DC | PRN
  Filled 2022-04-24: qty 1

## 2022-04-24 MED ORDER — ENOXAPARIN SODIUM 30 MG/0.3ML IJ SOSY
30.0000 mg | PREFILLED_SYRINGE | Freq: Two times a day (BID) | INTRAMUSCULAR | Status: DC
  Filled 2022-04-24 (×3): qty 0.3

## 2022-04-24 MED ORDER — METHOCARBAMOL 500 MG PO TABS
500.0000 mg | ORAL_TABLET | Freq: Three times a day (TID) | ORAL | Status: DC | PRN
  Administered 2022-04-24 – 2022-04-25 (×4): 500 mg via ORAL
  Filled 2022-04-24 (×4): qty 1

## 2022-04-24 MED ORDER — ONDANSETRON HCL 4 MG/2ML IJ SOLN: 4.0000 mg | Freq: Four times a day (QID) | INTRAMUSCULAR | Status: DC | PRN

## 2022-04-24 MED ORDER — MORPHINE SULFATE (PF) 2 MG/ML IV SOLN
1.0000 mg | INTRAVENOUS | Status: DC | PRN
  Administered 2022-04-25: 2 mg via INTRAVENOUS
  Filled 2022-04-24: qty 1

## 2022-04-24 MED ORDER — ONDANSETRON 4 MG PO TBDP: 4.0000 mg | ORAL_TABLET | Freq: Four times a day (QID) | ORAL | Status: DC | PRN

## 2022-04-24 MED ORDER — ACETAMINOPHEN 325 MG PO TABS
650.0000 mg | ORAL_TABLET | Freq: Four times a day (QID) | ORAL | Status: DC
  Administered 2022-04-24 – 2022-04-26 (×9): 650 mg via ORAL
  Filled 2022-04-24 (×9): qty 2

## 2022-04-24 MED ORDER — METHOCARBAMOL 1000 MG/10ML IJ SOLN: 500.0000 mg | Freq: Three times a day (TID) | INTRAVENOUS | Status: DC | PRN
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
## 2022-04-24 NOTE — ED Notes (Signed)
Pt refused straight stick for morning labs ?

## 2022-04-24 NOTE — Care Management Obs Status (Signed)
MEDICARE OBSERVATION STATUS NOTIFICATION ? ? ?Patient Details  ?Name: Ricky Reeves ?MRN: [id number] ?Date of Birth: December 22, 1956 ? ? ?Medicare Observation Status Notification Given:  Yes ? ? ? ?Bess Kinds, RN ?04/24/2022, 3:05 PM ?

## 2022-04-24 NOTE — Plan of Care (Signed)
  Problem: Health Behavior/Discharge Planning: Goal: Ability to manage health-related needs will improve Outcome: Progressing   
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
## 2022-04-24 NOTE — Progress Notes (Signed)
?  Transition of Care (TOC) Screening Note ? ? ?Patient Details  ?Name: Sten Basch ?Date of Birth: [birth year] ? ? ?Transition of Care (TOC) CM/SW Contact:    ?Benard Halsted, LCSW ?Phone Number: ?04/24/2022, 9:39 AM ? ? ? ?Transition of Care Department [facility]) has reviewed patient. We will continue to monitor patient advancement through interdisciplinary progression rounds. If new patient transition needs arise, please place a TOC consult. ? ? ?
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
## 2022-04-25 ENCOUNTER — Observation Stay (HOSPITAL_COMMUNITY): Payer: Medicare Other

## 2022-04-25 DIAGNOSIS — F17210 Nicotine dependence, cigarettes, uncomplicated: Secondary | ICD-10-CM | POA: Diagnosis present

## 2022-04-25 DIAGNOSIS — K7010 Alcoholic hepatitis without ascites: Secondary | ICD-10-CM | POA: Diagnosis present

## 2022-04-25 DIAGNOSIS — Z20822 Contact with and (suspected) exposure to covid-19: Secondary | ICD-10-CM | POA: Diagnosis present

## 2022-04-25 DIAGNOSIS — D696 Thrombocytopenia, unspecified: Secondary | ICD-10-CM | POA: Diagnosis present

## 2022-04-25 DIAGNOSIS — J9811 Atelectasis: Secondary | ICD-10-CM | POA: Diagnosis present

## 2022-04-25 DIAGNOSIS — K760 Fatty (change of) liver, not elsewhere classified: Secondary | ICD-10-CM | POA: Diagnosis present

## 2022-04-25 DIAGNOSIS — S2242XA Multiple fractures of ribs, left side, initial encounter for closed fracture: Secondary | ICD-10-CM | POA: Diagnosis present

## 2022-04-25 DIAGNOSIS — Z809 Family history of malignant neoplasm, unspecified: Secondary | ICD-10-CM | POA: Diagnosis not present

## 2022-04-25 DIAGNOSIS — S2239XA Fracture of one rib, unspecified side, initial encounter for closed fracture: Secondary | ICD-10-CM | POA: Diagnosis present

## 2022-04-25 DIAGNOSIS — Z5900 Homelessness unspecified: Secondary | ICD-10-CM | POA: Diagnosis not present

## 2022-04-25 DIAGNOSIS — F1020 Alcohol dependence, uncomplicated: Secondary | ICD-10-CM | POA: Diagnosis present

## 2022-04-25 DIAGNOSIS — S27321A Contusion of lung, unilateral, initial encounter: Secondary | ICD-10-CM | POA: Diagnosis present

## 2022-04-25 DIAGNOSIS — S060XAA Concussion with loss of consciousness status unknown, initial encounter: Secondary | ICD-10-CM | POA: Diagnosis present

## 2022-04-25 DIAGNOSIS — B182 Chronic viral hepatitis C: Secondary | ICD-10-CM | POA: Diagnosis present

## 2022-04-25 DIAGNOSIS — S270XXA Traumatic pneumothorax, initial encounter: Secondary | ICD-10-CM | POA: Diagnosis present

## 2022-04-25 DIAGNOSIS — Z833 Family history of diabetes mellitus: Secondary | ICD-10-CM | POA: Diagnosis not present

## 2022-04-25 DIAGNOSIS — Z79899 Other long term (current) drug therapy: Secondary | ICD-10-CM | POA: Diagnosis not present

## 2022-04-25 LAB — BASIC METABOLIC PANEL
Anion gap: 7 (ref 5–15)
BUN: 8 mg/dL (ref 8–23)
CO2: 24 mmol/L (ref 22–32)
Calcium: 8.3 mg/dL — ABNORMAL LOW (ref 8.9–10.3)
Chloride: 99 mmol/L (ref 98–111)
Creatinine, Ser: 0.69 mg/dL (ref 0.61–1.24)
GFR, Estimated: >60 mL/min (ref >60)
Glucose, Bld: 132 mg/dL — ABNORMAL HIGH (ref 70–99)
Potassium: 4.4 mmol/L (ref 3.5–5.1)
Sodium: 130 mmol/L — ABNORMAL LOW (ref 135–145)

## 2022-04-25 NOTE — Progress Notes (Signed)
Transition of Care (TOC) - CAGE-AID Screening ? ? ?Patient Details  ?Name: Ricky Reeves ?MRN: [id number] ?Date of Birth: 07/05/1956 ? ?Transition of Care (TOC) CM/SW Contact:    ?Clovis Cao, RN ?Phone Number: [phone] ?04/25/2022, 6:16 PM ? ? ?Clinical Narrative: ?Pt does not want to answer my questions and refuses to participate in assessment. ? ? ?CAGE-AID Screening: ?Substance Abuse Screening unable to be completed due to: : Patient Refused ? ?  ?  ?  ?  ?  ? ?  ? ?  ? ? ? ? ? ? ?

## 2022-04-25 NOTE — Progress Notes (Signed)
OT Cancellation Note ? ?Patient Details ?Name: Ricky Reeves ?MRN: [id number] ?DOB: 1956/12/08 ? ? ?Cancelled Treatment:    Reason Eval/Treat Not Completed: Patient declined, no reason specified Pt adamantly declined OT session at this time. Stated "I am sleeping and not going to answer any of your questions". OT will return later as time allows and pt is appropriate.  ? ?Rosey Bath OTR/L ?Acute Rehabilitation Services ?Office: [phone] ? ? ?Rebeca Alert ?04/25/2022, 10:51 AM ?

## 2022-04-25 NOTE — Plan of Care (Signed)
  Problem: Health Behavior/Discharge Planning: Goal: Ability to manage health-related needs will improve Outcome: Progressing   

## 2022-04-25 NOTE — Plan of Care (Signed)
°  Problem: Education: °Goal: Knowledge of General Education information will improve °Description: Including pain rating scale, medication(s)/side effects and non-pharmacologic comfort measures °Outcome: Progressing °  °Problem: Clinical Measurements: °Goal: Ability to maintain clinical measurements within normal limits will improve °Outcome: Progressing °  °Problem: Activity: °Goal: Risk for activity intolerance will decrease °Outcome: Progressing °  °Problem: Nutrition: °Goal: Adequate nutrition will be maintained °Outcome: Progressing °  °Problem: Coping: °Goal: Level of anxiety will decrease °Outcome: Progressing °  °Problem: Pain Managment: °Goal: General experience of comfort will improve °Outcome: Progressing °  °Problem: Skin Integrity: °Goal: Risk for impaired skin integrity will decrease °Outcome: Progressing °  °
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
## 2022-04-26 MED ORDER — GUAIFENESIN 200 MG PO TABS: 400.0000 mg | ORAL_TABLET | ORAL | 0 refills | Status: AC | PRN

## 2022-04-26 MED ORDER — OXYCODONE HCL 10 MG PO TABS: 5.0000 mg | ORAL_TABLET | Freq: Four times a day (QID) | ORAL | 0 refills | Status: AC | PRN

## 2022-04-26 MED ORDER — GUAIFENESIN 200 MG PO TABS
400.0000 mg | ORAL_TABLET | ORAL | Status: DC | PRN
  Filled 2022-04-26: qty 2

## 2022-04-26 MED ORDER — METHOCARBAMOL 500 MG PO TABS: 500.0000 mg | ORAL_TABLET | Freq: Three times a day (TID) | ORAL | 0 refills | Status: AC | PRN

## 2022-04-26 MED ORDER — IPRATROPIUM-ALBUTEROL 0.5-2.5 (3) MG/3ML IN SOLN
3.0000 mL | RESPIRATORY_TRACT | Status: DC | PRN
  Administered 2022-04-26: 3 mL via RESPIRATORY_TRACT
  Filled 2022-04-26: qty 3

## 2022-04-26 MED ORDER — ACETAMINOPHEN 325 MG PO TABS: 650.0000 mg | ORAL_TABLET | Freq: Four times a day (QID) | ORAL | Status: AC | PRN

## 2022-04-26 NOTE — TOC Transition Note (Signed)
Transition of Care (TOC) - CM/SW Discharge Note ? ? ?Patient Details  ?Name: Ricky Reeves ?MRN: [id number] ?Date of Birth: Nov 27, 1956 ?Marland Kitchen   ?[facility] of [facility]) CM/SW Contact:  ?Glennon Mac, RN ?Phone Number: ?04/26/2022, 10:39 AM ? ? ?Clinical Narrative:    ?Pt medically stable for discharge later today, per PA.  He states he plans to dc to a hotel/motel with his grandson.  PT/OT recommending no OP follow up or DME. DC medications sent to [facility] pharmacy; pt states he has insurance and needs no assistance with medications.  Pt states he may need transportation to hotel; offered cab voucher with the understanding that we will need the discharge address prior to providing voucher.   ? ? ?Final next level of care: Home/Self Care ?Barriers to Discharge: Barriers Resolved ? ? ?Patient Goals and CMS Choice ?Patient states their goals for this hospitalization and ongoing recovery are:: go to hotel with his grandson ?CMS Medicare.gov Compare Post Acute Care list provided to:: Patient ?Choice offered to / list presented to : NA ? ?  ?  ?  ?  ? ?Discharge Plan and Services ?In-house Referral: Clinical Social Work ?Discharge Planning Services: CM Consult ?           ?  ?  ?  ?  ?  ?  ?  ?  ?  ?  ? ?Social Determinants of Health (SDOH) Interventions ?  ? ? ?Readmission Risk Interventions ? ?  04/26/2022  ? 10:37 AM  ?Readmission Risk Prevention Plan  ?Post Dischage Appt Complete  ?Medication Screening Complete  ?Transportation Screening Complete  ? ?Quintella Baton, RN, BSN  ?Trauma/Neuro ICU Case Manager ?[phone] ? ? ? ? ?

## 2022-04-26 NOTE — TOC CAGE-AID Note (Signed)
Transition of Care (TOC) - CAGE-AID Screening ? ? ?Patient Details  ?Name: Ricky Reeves ?MRN: [id number] ?Date of Birth: [birth year] ? ?Transition of Care (TOC) CM/SW Contact:    ?Coralee Pesa, LCSWA ?Phone Number: ?04/26/2022, 12:50 PM ? ? ?Clinical Narrative: ?RN did assessment, no concerns noted, pt scored 0 on CAGE- AID. ? ? ?CAGE-AID Screening: ?Substance Abuse Screening unable to be completed due to: : Patient Refused ? ?  ?  ?  ?  ?  ? ?  ? ?  ? ? ? ? ? ? ?
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
## 2022-04-26 NOTE — Progress Notes (Signed)
Discharge instructions given. Patient verbalized understanding and all questions were answered. Awaiting a cab voucher.  ?

## 2022-05-03 ENCOUNTER — Ambulatory Visit
Admission: RE | Admit: 2022-05-03 | Discharge: 2022-05-03 | Disposition: A | Discharge status: Home / Self Care | Payer: Medicare Other | Source: Ambulatory Visit | Attending: Nurse Practitioner | Admitting: Nurse Practitioner

## 2022-05-03 ENCOUNTER — Other Ambulatory Visit: Payer: Self-pay | Admitting: Nurse Practitioner

## 2022-05-03 DIAGNOSIS — I82499 Acute embolism and thrombosis of other specified deep vein of unspecified lower extremity: Secondary | ICD-10-CM

## 2022-05-06 ENCOUNTER — Other Ambulatory Visit: Payer: Self-pay | Admitting: Pharmacist

## 2022-05-06 ENCOUNTER — Other Ambulatory Visit (HOSPITAL_COMMUNITY): Payer: Self-pay

## 2022-05-06 DIAGNOSIS — B182 Chronic viral hepatitis C: Secondary | ICD-10-CM

## 2022-05-06 MED ORDER — SOFOSBUVIR-VELPATASVIR 400-100 MG PO TABS
1.0000 | ORAL_TABLET | Freq: Every day | ORAL | 2 refills | Status: AC
  Filled 2022-05-06: qty 28, 28d supply, fill #0

## 2022-05-26 ENCOUNTER — Other Ambulatory Visit: Payer: Self-pay | Admitting: Physician Assistant

## 2022-05-26 ENCOUNTER — Other Ambulatory Visit (HOSPITAL_COMMUNITY): Payer: Self-pay | Admitting: Physician Assistant

## 2022-05-26 DIAGNOSIS — Z01818 Encounter for other preprocedural examination: Secondary | ICD-10-CM

## 2022-05-26 DIAGNOSIS — K7010 Alcoholic hepatitis without ascites: Secondary | ICD-10-CM

## 2022-05-27 ENCOUNTER — Ambulatory Visit (HOSPITAL_COMMUNITY): Admission: RE | Admit: 2022-05-27 | Payer: Medicare Other | Source: Ambulatory Visit

## 2022-06-24 ENCOUNTER — Other Ambulatory Visit (HOSPITAL_COMMUNITY): Payer: Self-pay

## 2022-07-01 ENCOUNTER — Ambulatory Visit: Payer: Medicare Other | Admitting: Internal Medicine

## 2022-07-13 ENCOUNTER — Telehealth: Payer: Self-pay

## 2022-07-13 NOTE — Telephone Encounter (Signed)
Patient called today to reschedule appointment with Dr. Renold Don. Per front desk patient had concerns regarding liver biopsy. States he does not want to do this. Per chart review patient missed biopsy appointment.  Will forward message to Md to advise. Juanita Laster, RMA

## 2022-07-13 NOTE — Telephone Encounter (Signed)
That's fine. Thanks!
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
## 2024-05-24 ENCOUNTER — Other Ambulatory Visit: Payer: Self-pay | Admitting: Nurse Practitioner

## 2024-05-24 DIAGNOSIS — R748 Abnormal levels of other serum enzymes: Secondary | ICD-10-CM

## 2024-06-11 ENCOUNTER — Other Ambulatory Visit

## 2024-06-14 ENCOUNTER — Ambulatory Visit
Admission: RE | Admit: 2024-06-14 | Discharge: 2024-06-14 | Disposition: A | Discharge status: Home / Self Care | Source: Ambulatory Visit | Attending: Nurse Practitioner | Admitting: Nurse Practitioner

## 2024-06-14 DIAGNOSIS — R748 Abnormal levels of other serum enzymes: Secondary | ICD-10-CM

## 2024-07-25 ENCOUNTER — Emergency Department (HOSPITAL_COMMUNITY)
Admission: EM | Admit: 2024-07-25 | Discharge: 2024-07-25 | Disposition: A | Discharge status: Home / Self Care | Attending: Student | Admitting: Student

## 2024-07-25 ENCOUNTER — Emergency Department (HOSPITAL_COMMUNITY)

## 2024-07-25 ENCOUNTER — Other Ambulatory Visit: Payer: Self-pay

## 2024-07-25 ENCOUNTER — Encounter (HOSPITAL_COMMUNITY): Payer: Self-pay

## 2024-07-25 DIAGNOSIS — M25562 Pain in left knee: Secondary | ICD-10-CM

## 2024-07-25 DIAGNOSIS — M1612 Unilateral primary osteoarthritis, left hip: Secondary | ICD-10-CM

## 2024-07-25 DIAGNOSIS — M25552 Pain in left hip: Secondary | ICD-10-CM | POA: Insufficient documentation

## 2024-07-25 DIAGNOSIS — Z5900 Homelessness unspecified: Secondary | ICD-10-CM | POA: Insufficient documentation

## 2024-07-25 DIAGNOSIS — M1712 Unilateral primary osteoarthritis, left knee: Secondary | ICD-10-CM | POA: Insufficient documentation

## 2024-07-25 MED ORDER — NAPROXEN 250 MG PO TABS
500.0000 mg | ORAL_TABLET | Freq: Once | ORAL | Status: AC
  Administered 2024-07-25: 500 mg via ORAL
  Filled 2024-07-25: qty 2

## 2024-07-25 MED ORDER — ACETAMINOPHEN 500 MG PO TABS
1000.0000 mg | ORAL_TABLET | Freq: Once | ORAL | Status: AC
Start: 2024-07-25 — End: 2024-07-25
  Administered 2024-07-25: 1000 mg via ORAL
  Filled 2024-07-25: qty 2

## 2024-07-25 NOTE — ED Provider Triage Note (Signed)
 Emergency Medicine Provider Triage Evaluation Note  Ricky Reeves , a 68 y.o. male  was evaluated in triage.  Pt complains of sudden onset of left knee pain that started about 5 days ago.  Denies any injuries.  Reports its painful to move the knee and pain also shoots up to the left hip.  Denies any numbness or tingling in his leg. No calf pain or swelling  Review of Systems  Positive: As above Negative: As above  Physical Exam  BP (!) 107/92 (BP Location: Left Arm)   Pulse (!) 106   Temp 98.1 F (36.7 C)   Resp 19   SpO2 97%  Gen:   Awake, no distress   Resp:  Normal effort  MSK:   Moves extremities without difficulty    Medical Decision Making  Medically screening exam initiated at 2:07 PM.  Appropriate orders placed.  Latif Nazareno was informed that the remainder of the evaluation will be completed by another provider, this initial triage assessment does not replace that evaluation, and the importance of remaining in the ED until their evaluation is complete.     Veta Palma, PA-C 07/25/24 1407

## 2024-07-25 NOTE — ED Notes (Signed)
 Pt refuses blood work, stating I don't need no blood work. Pt educated by OBIE Opal and pt still refusing

## 2024-07-25 NOTE — ED Provider Notes (Signed)
 [place] EMERGENCY DEPARTMENT AT [facility] Provider Note   CSN: [id number] Arrival date & time: 07/25/24  1253     Patient presents with: Knee Pain   Ricky Reeves is a 68 y.o. male with past medical history of hep C, homelessness, lumbar herniated disc presents to emergency department via EMS from a grocery store complaining of left knee pain for the past couple days. Has not tried OTC medication nor ice. Denies known injury, history of gout, fevers, chills    Knee Pain      Prior to Admission medications   Medication Sig Start Date End Date Taking? Authorizing Provider  acetaminophen  (TYLENOL ) 325 MG tablet Take 2 tablets (650 mg total) by mouth every 6 (six) hours as needed. 04/26/22   Tammy Sor, PA-C  Ascorbic Acid (VITAMIN C PO) Take 500 mg by mouth daily.    [provider]  CALCIUM PO Take 1 tablet by mouth daily.    [provider]  Ferrous Sulfate (IRON PO) Take 1 tablet by mouth daily.    [provider]  guaiFENesin  200 MG tablet Take 2 tablets (400 mg total) by mouth every 4 (four) hours as needed for cough or to loosen phlegm. 04/26/22   Tammy Sor, PA-C  ibuprofen (ADVIL) 200 MG tablet Take 200 mg by mouth every 6 (six) hours as needed for headache.    [provider]  methocarbamol  (ROBAXIN ) 500 MG tablet Take 1 tablet (500 mg total) by mouth every 8 (eight) hours as needed for muscle spasms. 04/26/22   Tammy Sor, PA-C  OVER THE COUNTER MEDICATION Take 1 tablet by mouth daily. Nugenix    [provider]  oxyCODONE  10 MG TABS Take 0.5-1 tablets (5-10 mg total) by mouth every 6 (six) hours as needed for severe pain. 04/26/22   Tammy Sor, PA-C  Potassium 95 MG TABS Take 95 mg by mouth daily.    [provider]  Sofosbuvir -Velpatasvir  (EPCLUSA ) 400-100 MG TABS Take 1 tablet by mouth daily. 05/06/22   Waddell Alan PARAS, RPH-CPP  TURMERIC PO Take 500 mg by mouth daily.    [provider]   zinc gluconate 50 MG tablet Take 50 mg by mouth daily.    [provider]    Allergies: Patient has no known allergies.    Review of Systems  Musculoskeletal:  Positive for joint swelling.    Updated Vital Signs BP (!) 144/84 (BP Location: Right Arm)   Pulse 81   Temp 98.3 F (36.8 C) (Oral)   Resp 18   SpO2 98%   Physical Exam Vitals and nursing note reviewed.  Constitutional:      General: He is not in acute distress.    Appearance: Normal appearance.  HENT:     Head: Normocephalic and atraumatic.  Eyes:     Conjunctiva/sclera: Conjunctivae normal.  Cardiovascular:     Rate and Rhythm: Normal rate.     Pulses:          Dorsalis pedis pulses are 2+ on the right side and 2+ on the left side.  Pulmonary:     Effort: Pulmonary effort is normal. No respiratory distress.  Musculoskeletal:     Right lower leg: No edema.     Left lower leg: No edema.     Comments: Left knee is mildly swollen to anterior and medial aspect.  No erythema, warmth, severe pain upon palpation.  ROM WNL.  Sensation 2/2 of BLE. No pedal edema, palpable cords,  nor TTP of BLE.  Toula' sign negative x 2  Skin:    Capillary Refill: Capillary refill takes less than 2 seconds.     Coloration: Skin is not jaundiced or pale.  Neurological:     Mental Status: He is alert and oriented to person, place, and time. Mental status is at baseline.     (all labs ordered are listed, but only abnormal results are displayed) Labs Reviewed - No data to display  EKG: None  Radiology: DG Hip Unilat W or Wo Pelvis 2-3 Views Left Result Date: 07/25/2024 CLINICAL DATA:  Hip pain. EXAM: DG HIP (WITH OR WITHOUT PELVIS) 2-3V LEFT COMPARISON:  None Available. FINDINGS: Pelvis is intact with normal and symmetric sacroiliac joints. No acute fracture or dislocation. No aggressive osseous lesion. Visualized sacral arcuate lines are unremarkable. Unremarkable symphysis pubis. There are mild degenerative changes of  bilateral hip joints characterized by mild joint space narrowing and osteophytosis of the superior acetabulum. No radiopaque foreign bodies. IMPRESSION: No acute osseous abnormality of the pelvis or left hip joint. Electronically Signed   By: Ree Molt M.D.   On: 07/25/2024 13:50   DG Knee Complete 4 Views Left Result Date: 07/25/2024 CLINICAL DATA:  pain, swelling. EXAM: LEFT KNEE - COMPLETE 4+ VIEW COMPARISON:  None Available. FINDINGS: No acute fracture or dislocation. No aggressive osseous lesion. There are degenerative changes of the knee joint in the form of mildly reduced medial tibio-femoral compartment joint space, tibial spiking and tricompartmental osteophytosis. Note is also made of meniscal chondrocalcinosis. There is a small to moderate suprapatellar knee joint effusion. No focal soft tissue swelling. No radiopaque foreign bodies. IMPRESSION: 1. No acute osseous abnormality of the left knee joint. Mild-to-moderate degenerative changes, as described above. 2. Small to moderate suprapatellar knee joint effusion. Electronically Signed   By: Ree Molt M.D.   On: 07/25/2024 13:49     Medications Ordered in the ED  acetaminophen  (TYLENOL ) tablet 1,000 mg (1,000 mg Oral Given 07/25/24 1434)  naproxen  (NAPROSYN ) tablet 500 mg (500 mg Oral Given 07/25/24 1804)                                    Medical Decision Making Amount and/or Complexity of Data Reviewed Radiology: ordered.  Risk Prescription drug management.   Patient presents to the ED for concern of left knee and left hip pain, this involves an extensive number of treatment options, and is a complaint that carries with it a high risk of complications and morbidity.  The differential diagnosis includes fracture, contusion, dislocation, arthritis, septic joint, gout, ligamentous injury   Co morbidities that complicate the patient evaluation  See hpi   Additional history obtained:  Additional history obtained from  Nursing and Outside Medical Records   External records from outside source obtained and reviewed including triage note, post recent BMP from 2023 to ensure no CKD    Imaging Studies ordered:  I ordered imaging studies including left hip and left knee x-rays I independently visualized and interpreted imaging which showed  mild degenerative changes of bilateral hip joints characterized by mild joint space narrowing and osteophytosis of the superior acetabulum mildly reduced medial tibio-femoral compartment joint space, tibial spiking and tricompartmental osteophytosis. Note is also made of meniscal chondrocalcinosis. small to moderate suprapatellar knee joint effusion  I agree with the radiologist interpretation     Medicines ordered and prescription drug management:  I  ordered medication including naprosyn , tylenol   for pain  Reevaluation of the patient after these medicines showed that the patient improved I have reviewed the patients home medicines and have made adjustments as needed     Problem List / ED Course:  Left knee pain Left hip pain OA No back pain, urinary symptoms, paresthesia, weakness, nor other complaints OA present at left hip and left knee ROM WNL Low suspicion for septic joint and gout as left hip and left knee are not erythematous, warm, not significantly tender to palpation.  ROM WNL.  No fever or tachycardia while in ED. No hx of gout Low suspicion for CVA/TIA, sciatica with no weakness nor sensory deficits. No back pain Low suspicion for DVT. Localized swelling to left knee. No tenderness along venous system. No erythema nor warmth nor pallor of LLE Discussed symptomatic care to include RICE, knee brace Will provide ortho f/u for further management of OA    Reevaluation:  After the interventions noted above, I reevaluated the patient and found that they have :improved   Social Determinants of Health:  Housing instability Current tobacco  abuse   Dispostion:  After consideration of the diagnostic results and the patients response to treatment, I feel that the patent would benefit from outpatient management with ortho f/u as needed.   Discussed ED workup, disposition, return to ED precautions with patient who expresses understanding agrees with plan.  All questions answered to their satisfaction.  They are agreeable to plan.  Discharge instructions provided on paperwork  Final diagnoses:  Acute pain of left knee  Osteoarthritis of left knee, unspecified osteoarthritis type    ED Discharge Orders     None        Minnie Tinnie BRAVO, PA 07/25/24 1905    Albertina Dixon, MD 07/25/24 [phone]

## 2024-07-25 NOTE — ED Notes (Signed)
 Patient transported to X-ray

## 2024-07-25 NOTE — ED Notes (Signed)
 This rn was informed from triage RN that pt refused blood work earlier. I asked pt if he would allow blood work now and he stated not right now. Pt is also refusing to allow NT to gather v/s.

## 2024-07-25 NOTE — Discharge Instructions (Addendum)
 Thank you for letting us  evaluate you today.  Your x-rays of your left knee and left hip show arthritis.  This is treated with nonsteroidal anti-inflammatory medication, rest, ice.  Please make sure to elevated leg to reduce swelling. We have given you a dose here in emergency department but you may pick up prescription at pharmacy to take as needed for pain.  Have also provided you with orthopedic follow-up for further management as arthritis unfortunately does not go away  Return to Emergency Department if you experience red hot swollen joint especially with fever to indicate concern for infection, significant worsening of symptoms

## 2024-07-25 NOTE — ED Triage Notes (Signed)
 Pt arrives via EMS from a grocery store. PT reports left knee pain for the past couple of days. Pt denies injury. PT AxOx4.
